# Patient Record
Sex: Male | Born: 1995 | Race: Black or African American | Marital: Single | Smoking: Never smoker
Health system: Southern US, Community
[De-identification: ages and names within clinical notes are randomized; demographics above are authoritative.]

---

## 2014-12-04 ENCOUNTER — Emergency Department: Payer: No Typology Code available for payment source

## 2014-12-04 ENCOUNTER — Encounter: Payer: Self-pay | Admitting: *Deleted

## 2014-12-04 DIAGNOSIS — Y92410 Unspecified street and highway as the place of occurrence of the external cause: Secondary | ICD-10-CM | POA: Insufficient documentation

## 2014-12-04 DIAGNOSIS — S5012XA Contusion of left forearm, initial encounter: Secondary | ICD-10-CM | POA: Diagnosis not present

## 2014-12-04 DIAGNOSIS — Y9389 Activity, other specified: Secondary | ICD-10-CM | POA: Insufficient documentation

## 2014-12-04 DIAGNOSIS — Y998 Other external cause status: Secondary | ICD-10-CM | POA: Insufficient documentation

## 2014-12-04 DIAGNOSIS — S50812A Abrasion of left forearm, initial encounter: Secondary | ICD-10-CM | POA: Insufficient documentation

## 2014-12-04 DIAGNOSIS — S59912A Unspecified injury of left forearm, initial encounter: Secondary | ICD-10-CM | POA: Diagnosis present
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
## 2014-12-05 ENCOUNTER — Emergency Department
Admission: EM | Admit: 2014-12-05 | Discharge: 2014-12-05 | Disposition: A | Discharge status: Home / Self Care | Payer: No Typology Code available for payment source | Attending: Emergency Medicine | Admitting: Emergency Medicine

## 2014-12-05 DIAGNOSIS — S50812A Abrasion of left forearm, initial encounter: Secondary | ICD-10-CM

## 2014-12-05 DIAGNOSIS — S5012XA Contusion of left forearm, initial encounter: Secondary | ICD-10-CM

## 2014-12-05 MED ORDER — OXYCODONE-ACETAMINOPHEN 5-325 MG PO TABS
1.0000 | ORAL_TABLET | Freq: Once | ORAL | Status: AC
  Administered 2014-12-05: 1 via ORAL

## 2014-12-05 MED ORDER — OXYCODONE-ACETAMINOPHEN 5-325 MG PO TABS
ORAL_TABLET | ORAL | Status: AC
  Administered 2014-12-05: 1 via ORAL
  Filled 2014-12-05: qty 1

## 2014-12-05 NOTE — ED Notes (Signed)
Pt decided not to leave, placed in room

## 2014-12-05 NOTE — Discharge Instructions (Signed)
Contusion °A contusion is a deep bruise. Contusions are the result of an injury that caused bleeding under the skin. The contusion may turn blue, purple, or yellow. Minor injuries will give you a painless contusion, but more severe contusions may stay painful and swollen for a few weeks.  °CAUSES  °A contusion is usually caused by a blow, trauma, or direct force to an area of the body. °SYMPTOMS  °· Swelling and redness of the injured area. °· Bruising of the injured area. °· Tenderness and soreness of the injured area. °· Pain. °DIAGNOSIS  °The diagnosis can be made by taking a history and physical exam. An X-ray, CT scan, or MRI may be needed to determine if there were any associated injuries, such as fractures. °TREATMENT  °Specific treatment will depend on what area of the body was injured. In general, the best treatment for a contusion is resting, icing, elevating, and applying cold compresses to the injured area. Over-the-counter medicines may also be recommended for pain control. Ask your caregiver what the best treatment is for your contusion. °HOME CARE INSTRUCTIONS  °· Put ice on the injured area. °· Put ice in a plastic bag. °· Place a towel between your skin and the bag. °· Leave the ice on for 15-20 minutes, 3-4 times a day, or as directed by your health care provider. °· Only take over-the-counter or prescription medicines for pain, discomfort, or fever as directed by your caregiver. Your caregiver may recommend avoiding anti-inflammatory medicines (aspirin, ibuprofen, and naproxen) for 48 hours because these medicines may increase bruising. °· Rest the injured area. °· If possible, elevate the injured area to reduce swelling. °SEEK IMMEDIATE MEDICAL CARE IF:  °· You have increased bruising or swelling. °· You have pain that is getting worse. °· Your swelling or pain is not relieved with medicines. °MAKE SURE YOU:  °· Understand these instructions. °· Will watch your condition. °· Will get help right  away if you are not doing well or get worse. °Document Released: 12/03/2004 Document Revised: 02/28/2013 Document Reviewed: 12/29/2010 °[employer]® Patient Information ©2015 [employer]. This information is not intended to replace advice given to you by your health care provider. Make sure you discuss any questions you have with your health care provider. ° °Abrasion °An abrasion is a cut or scrape of the skin. Abrasions do not extend through all layers of the skin and most heal within 10 days. It is important to care for your abrasion properly to prevent infection. °CAUSES  °Most abrasions are caused by falling on, or gliding across, the ground or other surface. When your skin rubs on something, the outer and inner layer of skin rubs off, causing an abrasion. °DIAGNOSIS  °Your caregiver will be able to diagnose an abrasion during a physical exam.  °TREATMENT  °Your treatment depends on how large and deep the abrasion is. Generally, your abrasion will be cleaned with water and a mild soap to remove any dirt or debris. An antibiotic ointment may be put over the abrasion to prevent an infection. A bandage (dressing) may be wrapped around the abrasion to keep it from getting dirty.  °You may need a tetanus shot if: °· You cannot remember when you had your last tetanus shot. °· You have never had a tetanus shot. °· The injury broke your skin. °If you get a tetanus shot, your arm may swell, get red, and feel warm to the touch. This is common and not a problem. If you need a   tetanus shot and you choose not to have one, there is a rare chance of getting tetanus. Sickness from tetanus can be serious.  °HOME CARE INSTRUCTIONS  °· If a dressing was applied, change it at least once a day or as directed by your caregiver. If the bandage sticks, soak it off with warm water.   °· Wash the area with water and a mild soap to remove all the ointment 2 times a day. Rinse off the soap and pat the area dry with a clean towel.    °· Reapply any ointment as directed by your caregiver. This will help prevent infection and keep the bandage from sticking. Use gauze over the wound and under the dressing to help keep the bandage from sticking.   °· Change your dressing right away if it becomes wet or dirty.   °· Only take over-the-counter or prescription medicines for pain, discomfort, or fever as directed by your caregiver.   °· Follow up with your caregiver within 24-48 hours for a wound check, or as directed. If you were not given a wound-check appointment, look closely at your abrasion for redness, swelling, or pus. These are signs of infection. °SEEK IMMEDIATE MEDICAL CARE IF:  °· You have increasing pain in the wound.   °· You have redness, swelling, or tenderness around the wound.   °· You have pus coming from the wound.   °· You have a fever or persistent symptoms for more than 2-3 days. °· You have a fever and your symptoms suddenly get worse. °· You have a bad smell coming from the wound or dressing.   °MAKE SURE YOU:  °· Understand these instructions. °· Will watch your condition. °· Will get help right away if you are not doing well or get worse. °Document Released: 12/03/2004 Document Revised: 02/10/2012 Document Reviewed: 01/27/2011 °[employer]® Patient Information ©2015 [employer]. This information is not intended to replace advice given to you by your health care provider. Make sure you discuss any questions you have with your health care provider. ° °
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
## 2021-10-07 DEATH — deceased
